# Patient Record
Sex: Male | Born: 1970 | Race: Black or African American | Hispanic: No | Marital: Married | Smoking: Current some day smoker
Health system: Southern US, Community
[De-identification: ages and names within clinical notes are randomized; demographics above are authoritative.]

## PROBLEM LIST (undated history)

## (undated) DIAGNOSIS — I219 Acute myocardial infarction, unspecified: Secondary | ICD-10-CM

## (undated) DIAGNOSIS — I10 Essential (primary) hypertension: Secondary | ICD-10-CM

---

## 2018-08-14 ENCOUNTER — Other Ambulatory Visit: Payer: Self-pay

## 2018-08-14 ENCOUNTER — Encounter (HOSPITAL_COMMUNITY): Payer: Self-pay | Admitting: Emergency Medicine

## 2018-08-14 ENCOUNTER — Emergency Department (HOSPITAL_COMMUNITY): Payer: Self-pay

## 2018-08-14 ENCOUNTER — Emergency Department (HOSPITAL_COMMUNITY)
Admission: EM | Admit: 2018-08-14 | Discharge: 2018-08-15 | Disposition: A | Discharge status: Home / Self Care | Payer: Self-pay | Attending: Emergency Medicine | Admitting: Emergency Medicine

## 2018-08-14 DIAGNOSIS — F17290 Nicotine dependence, other tobacco product, uncomplicated: Secondary | ICD-10-CM | POA: Insufficient documentation

## 2018-08-14 DIAGNOSIS — I252 Old myocardial infarction: Secondary | ICD-10-CM | POA: Insufficient documentation

## 2018-08-14 DIAGNOSIS — R079 Chest pain, unspecified: Secondary | ICD-10-CM

## 2018-08-14 DIAGNOSIS — I7121 Aneurysm of the ascending aorta, without rupture: Secondary | ICD-10-CM

## 2018-08-14 DIAGNOSIS — Z79899 Other long term (current) drug therapy: Secondary | ICD-10-CM | POA: Insufficient documentation

## 2018-08-14 DIAGNOSIS — I712 Thoracic aortic aneurysm, without rupture: Secondary | ICD-10-CM | POA: Insufficient documentation

## 2018-08-14 DIAGNOSIS — I10 Essential (primary) hypertension: Secondary | ICD-10-CM | POA: Insufficient documentation

## 2018-08-14 HISTORY — DX: Essential (primary) hypertension: I10

## 2018-08-14 HISTORY — DX: Acute myocardial infarction, unspecified: I21.9

## 2018-08-14 LAB — CBC
HCT: 40.7 % (ref 39.0–52.0)
HEMOGLOBIN: 13.9 g/dL (ref 13.0–17.0)
MCH: 30.9 pg (ref 26.0–34.0)
MCHC: 34.2 g/dL (ref 30.0–36.0)
MCV: 90.4 fL (ref 78.0–100.0)
PLATELETS: 264 10*3/uL (ref 150–400)
RBC: 4.5 MIL/uL (ref 4.22–5.81)
RDW: 14 % (ref 11.5–15.5)
WBC: 7.8 10*3/uL (ref 4.0–10.5)

## 2018-08-14 LAB — BASIC METABOLIC PANEL
ANION GAP: 12 (ref 5–15)
BUN: 13 mg/dL (ref 6–20)
CHLORIDE: 100 mmol/L (ref 98–111)
CO2: 26 mmol/L (ref 22–32)
CREATININE: 1.19 mg/dL (ref 0.61–1.24)
Calcium: 8.6 mg/dL — ABNORMAL LOW (ref 8.9–10.3)
GFR calc non Af Amer: >60 mL/min (ref >60)
Glucose, Bld: 104 mg/dL — ABNORMAL HIGH (ref 70–99)
POTASSIUM: 3.2 mmol/L — AB (ref 3.5–5.1)
SODIUM: 138 mmol/L (ref 135–145)

## 2018-08-14 LAB — I-STAT TROPONIN, ED
TROPONIN I, POC: 0.03 ng/mL (ref 0.00–0.08)
Troponin i, poc: 0 ng/mL (ref 0.00–0.08)

## 2018-08-14 MED ORDER — IOPAMIDOL (ISOVUE-370) INJECTION 76%
100.0000 mL | Freq: Once | INTRAVENOUS | Status: AC | PRN
  Administered 2018-08-14: 100 mL via INTRAVENOUS

## 2018-08-14 MED ORDER — IBUPROFEN 400 MG PO TABS
400.0000 mg | ORAL_TABLET | Freq: Once | ORAL | Status: AC | PRN
  Administered 2018-08-14: 400 mg via ORAL
  Filled 2018-08-14: qty 1

## 2018-08-14 MED ORDER — HYDROCODONE-ACETAMINOPHEN 5-325 MG PO TABS: 1.0000 | ORAL_TABLET | Freq: Four times a day (QID) | ORAL | 0 refills | Status: AC | PRN

## 2018-08-14 MED ORDER — IOPAMIDOL (ISOVUE-370) INJECTION 76%
INTRAVENOUS | Status: AC
  Filled 2018-08-14: qty 100

## 2018-08-14 NOTE — ED Triage Notes (Signed)
Pt reports upper chest pain radiating to back an L arm. Pt reports the pain worsens with movement of L arm or changing position. Pt reports SHOB, weakness to legs, dizziness, and lightheadedness. Pt denies nausea, vomiting. Pt reports he took 3 nitro with minimal relief. Pt also took 324 of ASA. 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

## 2018-08-14 NOTE — Discharge Instructions (Addendum)
Your ascending aorta is around 4 cm.  Have your primary doctors follow-up with this.  Take the Motrin as needed for pain.

## 2018-08-15 MED ORDER — HYDROCODONE-ACETAMINOPHEN 5-325 MG PO TABS
1.0000 | ORAL_TABLET | Freq: Once | ORAL | Status: AC
  Administered 2018-08-15: 1 via ORAL
  Filled 2018-08-15: qty 1

## 2018-08-15 NOTE — ED Provider Notes (Signed)
[facility] EMERGENCY DEPARTMENT Provider Note   CSN: [id number] Arrival date & time: 08/14/18  1623     History   Chief Complaint Chief Complaint  Patient presents with  . Chest Pain    HPI Mark [facility]ward is a 47 y.o. male.  HPI Patient presents with left-sided chest pain.  Goes to left arm.  Worse with movement.  Some shortness of breath.  Feels he is having more shortness of breath on exertion.  No relief with nitroglycerin.  Has previous stent.  No swelling in his legs.  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 Past Medical History:  Diagnosis Date  . Hypertension   . Myocardial infarct (HCC)     There are no active problems to display for this patient.   History reviewed. No pertinent surgical history.      Home Medications    Prior to Admission medications   Medication Sig Start Date End Date Taking? Authorizing Provider  acetaminophen (TYLENOL) 500 MG tablet Take 500 mg by mouth every 6 (six) hours as needed (pain).   Yes [provider]  amLODipine (NORVASC) 5 MG tablet Take 5 mg by mouth daily.   Yes [provider]  aspirin EC 325 MG tablet Take 325 mg by mouth daily as needed (pain).   Yes [provider]  aspirin EC 81 MG tablet Take 81 mg by mouth daily.   Yes [provider]  Budesonide-Formoterol Fumarate (SYMBICORT IN) Inhale 1 puff into the lungs 2 (two) times daily as needed (shortness of breath).   Yes [provider]  carvedilol (COREG) 25 MG tablet Take 25 mg by mouth every 12 (twelve) hours.   Yes [provider]  colchicine 0.6 MG tablet Take 0.6 mg by mouth daily.   Yes [provider]  digoxin (LANOXIN) 0.125 MG tablet Take 0.125 mg by mouth daily.   Yes [provider]  diphenhydrAMINE (BENADRYL) 25 MG tablet Take 25 mg by mouth every 6 (six) hours as needed for allergies.   Yes [provider]  Evolocumab (REPATHA SURECLICK) 140 MG/ML SOAJ Inject 140 mg into the skin every 14 (fourteen) days.   Yes [provider]  furosemide (LASIX) 40 MG tablet Take 60 mg by mouth daily.   Yes [provider]  ibuprofen (ADVIL,MOTRIN) 800 MG tablet Take 800 mg by mouth every 8 (eight) hours as needed (pain).   Yes [provider]  Iron-Vitamins (GERITOL PO) Take by mouth daily.   Yes [provider]  METRONIDAZOLE PO Take 1 tablet by mouth once.   Yes [provider]  Multiple Vitamin (MULTIVITAMIN WITH MINERALS) TABS tablet Take 1 tablet by mouth daily.   Yes [provider]  naproxen sodium (ALEVE) 220 MG tablet Take 660 mg by mouth 2 (two) times daily as needed (pain).   Yes [provider]  nitroGLYCERIN (NITROSTAT) 0.4 MG SL tablet Place 0.4 mg under the tongue every 5 (five) minutes as needed for chest pain.   Yes [provider]  prasugrel (EFFIENT) 10 MG TABS tablet Take 10 mg by mouth daily.   Yes [provider]  PRESCRIPTION MEDICATION Inhale into the lungs at bedtime. CPAP   Yes [provider]  HYDROcodone-acetaminophen (NORCO/VICODIN) 5-325 MG tablet Take 1-2 tablets by mouth every 6 (six) hours as needed. 08/14/18   Benjiman Core, MD    Family History History reviewed. No pertinent family  history.  Social History Social History   Tobacco Use  . Smoking status: Current Some Day Smoker    Types: Cigars  . Smokeless tobacco: Never Used  Substance Use Topics  . Alcohol use: Not Currently  . Drug use: Never     Allergies   Patient has no known allergies.   Review of Systems Review of Systems  Constitutional: Negative for appetite change.  HENT: Negative for congestion.   Respiratory: Positive for shortness of breath.   Cardiovascular: Positive for chest pain.  Gastrointestinal: Negative for abdominal pain.  Genitourinary: Negative for dysuria.  Musculoskeletal: Negative for  back pain.  Neurological: Negative for weakness.  Hematological: Negative for adenopathy.  Psychiatric/Behavioral: Negative for confusion.     Physical Exam Updated Vital Signs BP (!) 141/92   Pulse 84   Temp 98.8 F (37.1 C) (Oral)   Resp 15   Ht 6' (1.829 m)   Wt 129.3 kg   SpO2 92%   BMI 38.65 kg/m    Physical Exam  Constitutional: He appears well-developed.  HENT:  Head: Normocephalic.  Neck: Neck supple.  Cardiovascular: Regular rhythm and normal pulses.  Musculoskeletal:       Right lower leg: He exhibits no edema.       Left lower leg: He exhibits no edema.  Neurological: He is alert.  Skin: Skin is warm. Capillary refill takes less than 2 seconds.  Psychiatric: He has a normal mood and affect.     ED Treatments / Results  Labs (all labs ordered are listed, but only abnormal results are displayed) Labs Reviewed  BASIC METABOLIC PANEL - Abnormal; Notable for the following components:      Result Value   Potassium 3.2 (*)    Glucose, Bld 104 (*)    Calcium 8.6 (*)    All other components within normal limits  CBC  I-STAT TROPONIN, ED  I-STAT TROPONIN, ED    EKG EKG Interpretation  Date/Time:  Friday August 14 2018 16:31:11 EDT Ventricular Rate:  83 PR Interval:  212 QRS Duration: 100 QT Interval:  372 QTC Calculation: 437 R Axis:   -44 Text Interpretation:  Sinus rhythm with marked sinus arrhythmia with 1st degree A-V block Possible Left atrial enlargement Left axis deviation Septal infarct , age undetermined Abnormal ECG Confirmed by Benjiman Core [phone]) on 08/14/2018 9:43:34 PM   Radiology Dg Chest 2 View  Result Date: 08/14/2018 CLINICAL DATA:  Bilateral chest pain EXAM: CHEST - 2 VIEW COMPARISON:  None. FINDINGS: Mild cardiomegaly. The hila and mediastinum are normal. No pulmonary nodules or masses. Mild interstitial prominence without overt edema. No focal infiltrate. Mild bibasilar atelectasis. IMPRESSION: Cardiomegaly. Possible mild  pulmonary venous congestion without overt edema. No focal infiltrate. Electronically Signed   By: Gerome Sam III M.D   On: 08/14/2018 17:08   Ct Angio Chest Pe W And/or Wo Contrast  Result Date: 08/14/2018 CLINICAL DATA:  Upper chest pain radiating to the left arm, worse with movement and change in position. Dyspnea. EXAM: CT ANGIOGRAPHY CHEST WITH CONTRAST TECHNIQUE: Multidetector CT imaging of the chest was performed using the standard protocol during bolus administration of intravenous contrast. Multiplanar CT image reconstructions and MIPs were obtained to evaluate the vascular anatomy. CONTRAST:  70 cc ISOVUE-370 IOPAMIDOL (ISOVUE-370) INJECTION 76% COMPARISON:  Same day CXR FINDINGS: Cardiovascular: Common arterial branch of the right brachiocephalic and left common carotid arteries off the aortic arch, an anatomic variant. Additionally the left vertebral artery also directly takes off from  the arch. 4 cm ascending thoracic aortic aneurysm without dissection. Preferential opacification of pulmonary arterial system however for this study. Satisfactory pulmonary arterial opacification to the segmental level. No acute pulmonary embolus. 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 Cardiomegaly without pericardial effusion. Mediastinum/Nodes: No enlarged mediastinal, hilar, or axillary lymph nodes. Thyroid gland, trachea, and esophagus demonstrate no significant findings. Lungs/Pleura: No effusion or pneumothorax. Subtle ground-glass opacities are noted at the lung bases left greater than right. Findings likely reflect areas of passive atelectasis. Subtle pneumonia is not excluded especially at the left lung base given its slightly more prominent appearance. Upper Abdomen: No acute abnormality. Musculoskeletal: No chest wall abnormality. No acute or significant osseous findings. Review of the MIP images confirms the above findings. IMPRESSION: 1. 4 cm ascending thoracic aortic aneurysm without  dissection. Recommend annual imaging followup by CTA or MRA. This recommendation follows 2010 ACCF/AHA/AATS/ACR/ASA/SCA/SCAI/SIR/STS/SVM Guidelines for the Diagnosis and Management of Patients with Thoracic Aortic Disease. Circulation. 2010; 121: [id number] 2. No acute pulmonary embolus. 3. Faint ground-glass opacities at the lung bases felt to represent passive atelectasis. The possibility subtle pneumonia is not entirely excluded given the slightly more confluent appearance involving the left lower lobe. Aortic aneurysm NOS (ICD10-I71.9). Electronically Signed   By: Tollie Eth M.D.   On: 08/14/2018 23:33    Procedures Procedures (including critical care time)  Medications Ordered in ED Medications  iopamidol (ISOVUE-370) 76 % injection (has no administration in time range)  ibuprofen (ADVIL,MOTRIN) tablet 400 mg (400 mg Oral Given 08/14/18 1854)  iopamidol (ISOVUE-370) 76 % injection 100 mL (100 mLs Intravenous Contrast Given 08/14/18 2237)     Initial Impression / Assessment and Plan / ED Course  I have reviewed the triage vital signs and the nursing notes.  Pertinent labs & imaging results that were available during my care of the patient were reviewed by me and considered in my medical decision making (see chart for details).    Patient with chest pain.  Reproducible on left chest.  Does however have an aortic aneurysm.  It is 4 cm..  Doubt this is the cause of the pain.  We will follow-up in [place] with his primary care doctors.  No dissection seen but not necessarily timed for optimal visualization of this. Discharge home.  Final Clinical Impressions(s) / ED Diagnoses   Final diagnoses:  Nonspecific chest pain  Ascending aortic aneurysm [facility])    ED Discharge Orders         Ordered    HYDROcodone-acetaminophen (NORCO/VICODIN) 5-325 MG tablet  Every 6 hours PRN     08/14/18 2358          Benjiman Core, MD 08/15/18 0007

## 2020-04-16 IMAGING — CT CT ANGIO CHEST
2 of 6 series · 18 of 36 positions shown · IV contrast (iopamidol)
Comparison: Same day CXR

CLINICAL DATA: Upper chest pain radiating to the left arm, worse
with movement and change in position. Dyspnea.

EXAM:
CT ANGIOGRAPHY CHEST WITH CONTRAST
TECHNIQUE: Multidetector CT imaging of the chest was performed using the
standard protocol during bolus administration of intravenous
contrast. Multiplanar CT image reconstructions and MIPs were
obtained to evaluate the vascular anatomy.
CONTRAST:  70 cc 3V31CF-GNV IOPAMIDOL (3V31CF-GNV) INJECTION 76%

[Series 7: pe thins · axial · 0.79mm/px · z∈[+1250,+1486]mm · 17 of 375 slices shown]
[im 19/375  lung]
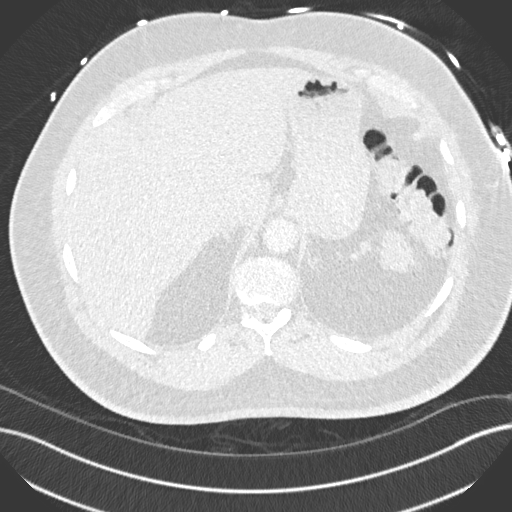
[im 38/375  mediastinal]
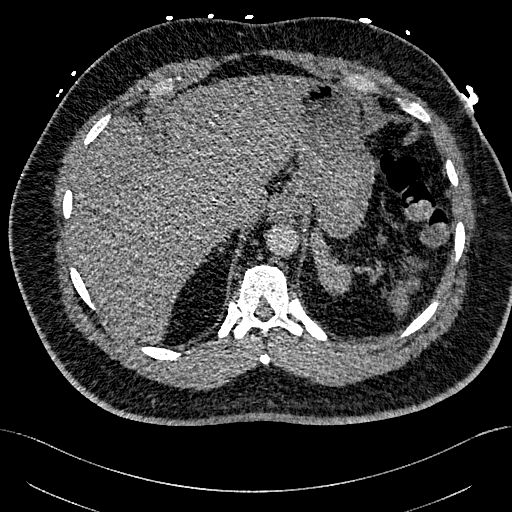
[im 57/375  lung]
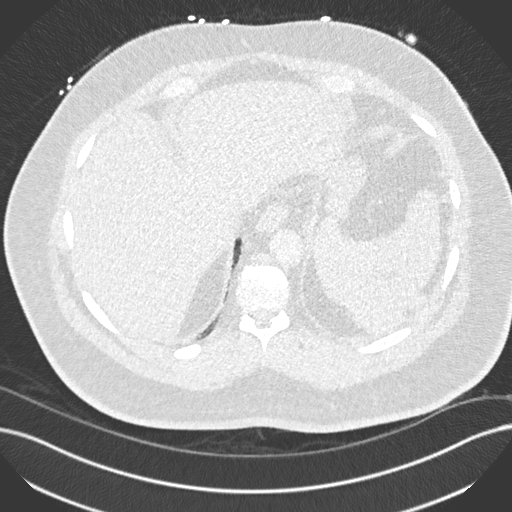
[im 75/375  mediastinal]
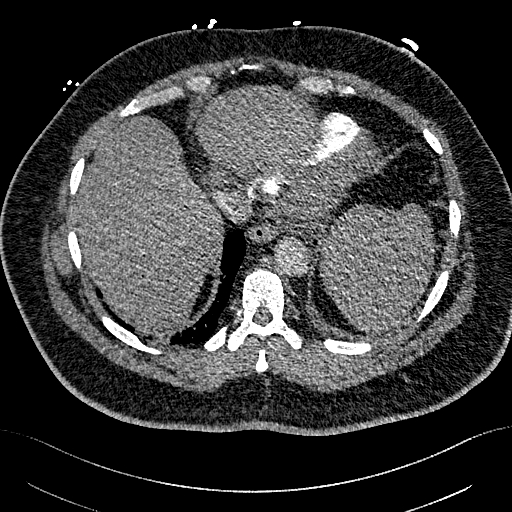
[im 113/375  lung]
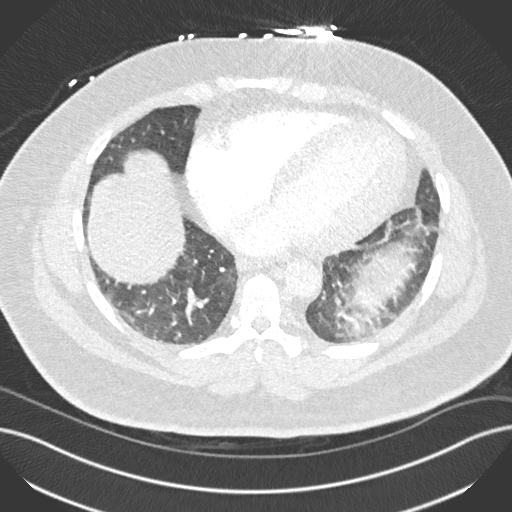
[im 131/375  mediastinal]
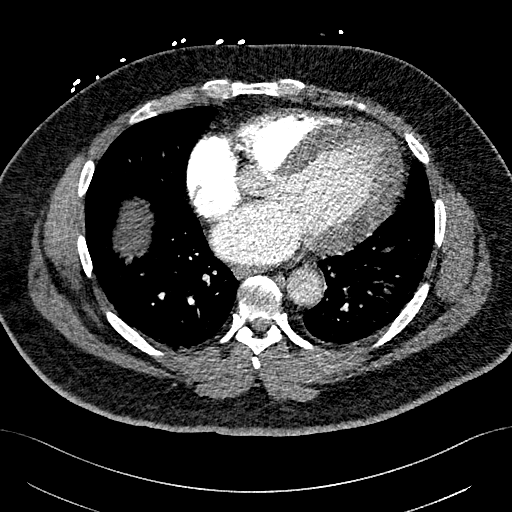
[im 150/375  lung]
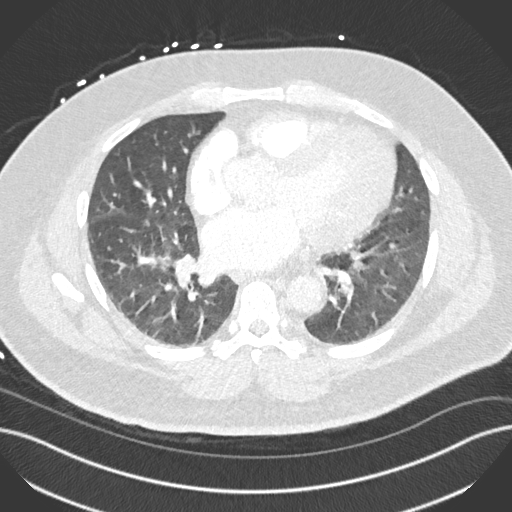
[im 169/375  mediastinal]
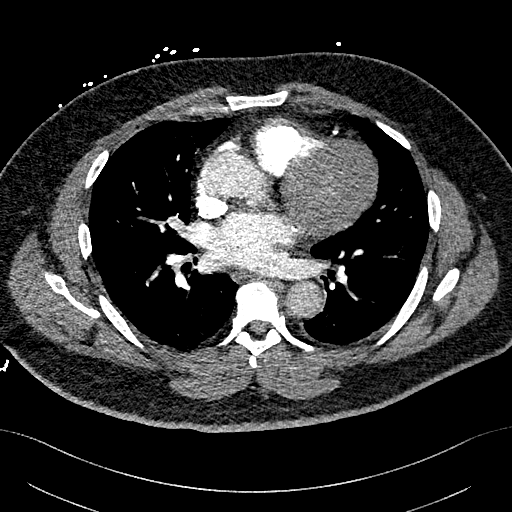
[im 188/375  lung]
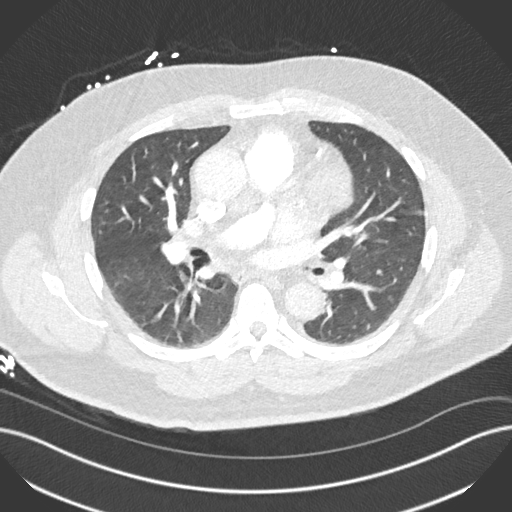
[im 206/375  mediastinal]
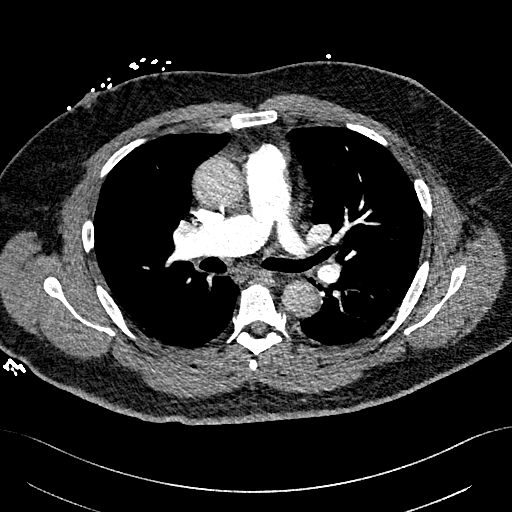
[im 225/375  lung]
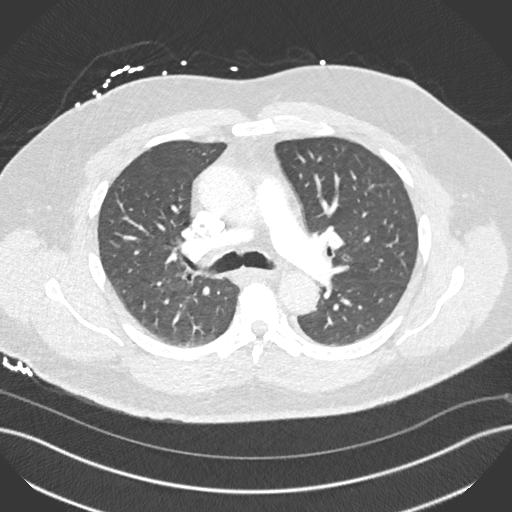
[im 244/375  mediastinal]
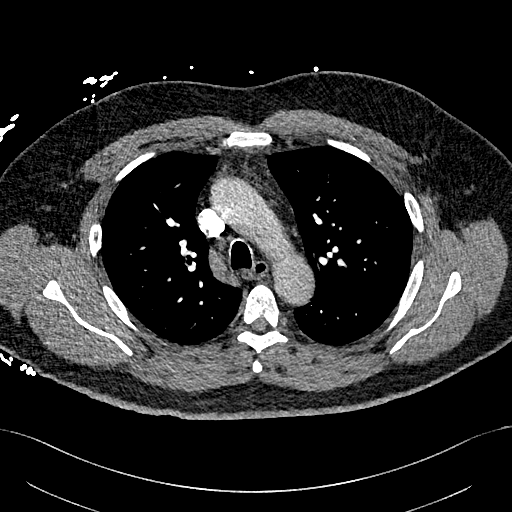
[im 262/375  lung]
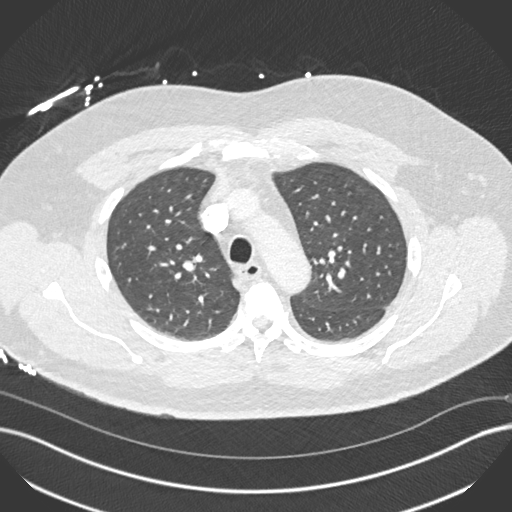
[im 300/375  mediastinal]
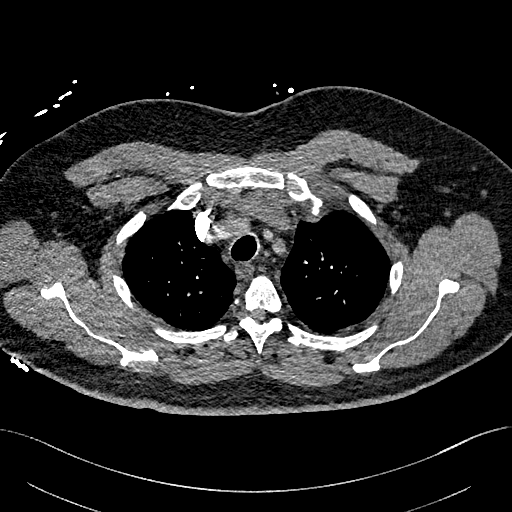
[im 318/375  lung]
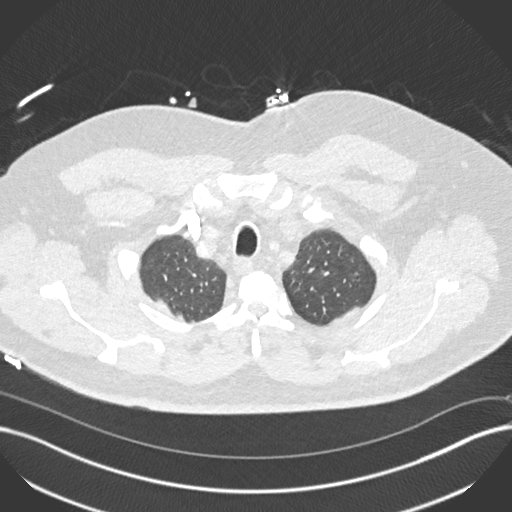
[im 337/375  mediastinal]
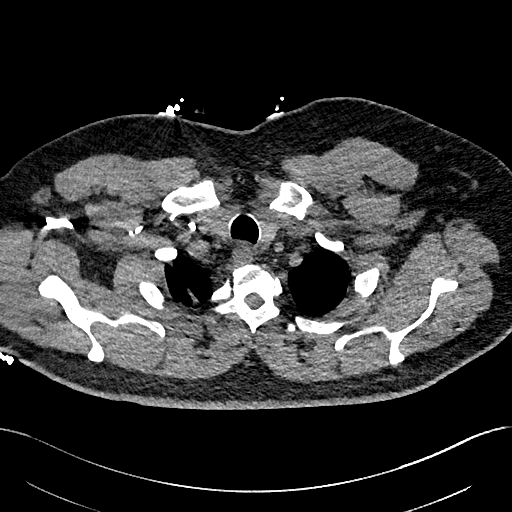
[im 356/375  lung]
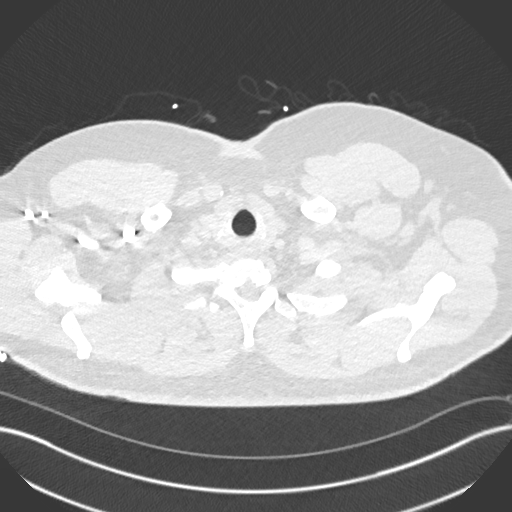

[Series 8: pe 2mm cor · coronal · 0.62mm/px · 1 of 151 slices shown]
[im 76/151  mediastinal]
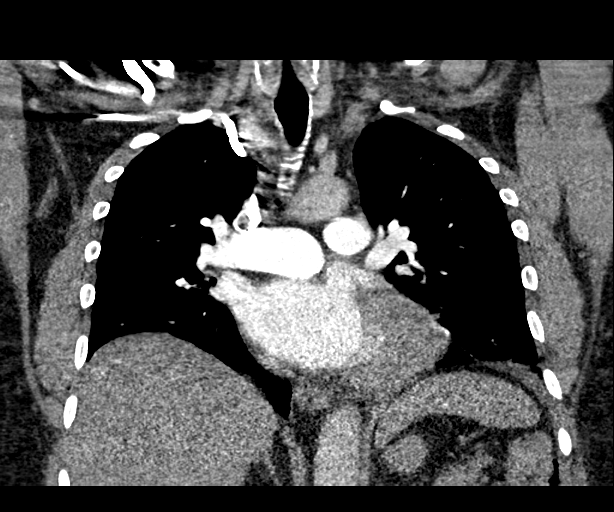

[18 of 36 positions shown; findings below may reference images not displayed]

FINDINGS: Cardiovascular: Common arterial branch of the right brachiocephalic
and left common carotid arteries off the aortic arch, an anatomic
variant. Additionally the left vertebral artery also directly takes
off from the arch. 4 cm ascending thoracic aortic aneurysm without
dissection. Preferential opacification of pulmonary arterial system
however for this study. Satisfactory pulmonary arterial
opacification to the segmental level. No acute pulmonary embolus.
Three-vessel coronary arteriosclerosis is identified. Cardiomegaly
without pericardial effusion.

Mediastinum/Nodes: No enlarged mediastinal, hilar, or axillary lymph
nodes. Thyroid gland, trachea, and esophagus demonstrate no
significant findings.

Lungs/Pleura: No effusion or pneumothorax. Subtle ground-glass
opacities are noted at the lung bases left greater than right.
Findings likely reflect areas of passive atelectasis. Subtle
pneumonia is not excluded especially at the left lung base given its
slightly more prominent appearance.

Upper Abdomen: No acute abnormality.

Musculoskeletal: No chest wall abnormality. No acute or significant
osseous findings.

Review of the MIP images confirms the above findings.
IMPRESSION: 1. 4 cm ascending thoracic aortic aneurysm without dissection.
Recommend annual imaging followup by CTA or MRA. This recommendation
follows 5787 ACCF/AHA/AATS/ACR/ASA/SCA/SALOM/GOYA/JAEPA/YANN Guidelines
for the Diagnosis and Management of Patients with Thoracic Aortic
Disease. Circulation. 5787; 121: [id number]
2. No acute pulmonary embolus.
3. Faint ground-glass opacities at the lung bases felt to represent
passive atelectasis. The possibility subtle pneumonia is not
entirely excluded given the slightly more confluent appearance
involving the left lower lobe.

Aortic aneurysm NOS ([id number].4).
